# Patient Record
Sex: Male | Born: 1985 | Race: White | Hispanic: No | Marital: Single | Smoking: Never smoker
Health system: Southern US, Community
[De-identification: ages and names within clinical notes are randomized; demographics above are authoritative.]

---

## 2009-05-06 ENCOUNTER — Emergency Department (HOSPITAL_COMMUNITY): Admission: EM | Admit: 2009-05-06 | Discharge: 2009-05-06 | Payer: Self-pay | Admitting: Emergency Medicine

## 2014-08-15 ENCOUNTER — Emergency Department (HOSPITAL_COMMUNITY): Payer: BLUE CROSS/BLUE SHIELD

## 2014-08-15 ENCOUNTER — Emergency Department (HOSPITAL_COMMUNITY)
Admission: EM | Admit: 2014-08-15 | Discharge: 2014-08-15 | Disposition: A | Discharge status: Home / Self Care | Payer: BLUE CROSS/BLUE SHIELD | Attending: Emergency Medicine | Admitting: Emergency Medicine

## 2014-08-15 ENCOUNTER — Encounter (HOSPITAL_COMMUNITY): Payer: Self-pay

## 2014-08-15 DIAGNOSIS — W270XXA Contact with workbench tool, initial encounter: Secondary | ICD-10-CM | POA: Diagnosis not present

## 2014-08-15 DIAGNOSIS — Y9389 Activity, other specified: Secondary | ICD-10-CM | POA: Diagnosis not present

## 2014-08-15 DIAGNOSIS — Y9289 Other specified places as the place of occurrence of the external cause: Secondary | ICD-10-CM | POA: Diagnosis not present

## 2014-08-15 DIAGNOSIS — S8992XA Unspecified injury of left lower leg, initial encounter: Secondary | ICD-10-CM | POA: Diagnosis present

## 2014-08-15 DIAGNOSIS — Y998 Other external cause status: Secondary | ICD-10-CM | POA: Insufficient documentation

## 2014-08-15 DIAGNOSIS — S81812A Laceration without foreign body, left lower leg, initial encounter: Secondary | ICD-10-CM | POA: Diagnosis not present

## 2014-08-15 MED ORDER — LIDOCAINE-EPINEPHRINE 2 %-1:100000 IJ SOLN
INTRAMUSCULAR | Status: AC
  Filled 2014-08-15: qty 1

## 2014-08-15 MED ORDER — LIDOCAINE-EPINEPHRINE (PF) 2 %-1:200000 IJ SOLN: 10.0000 mL | Freq: Once | INTRAMUSCULAR | Status: DC

## 2014-08-15 MED ORDER — LIDOCAINE HCL 2 % IJ SOLN: 20.0000 mL | Freq: Once | INTRAMUSCULAR | Status: DC

## 2014-08-15 MED ORDER — NAPROXEN 250 MG PO TABS: 250.0000 mg | ORAL_TABLET | Freq: Two times a day (BID) | ORAL | Status: AC

## 2014-08-15 NOTE — ED Notes (Signed)
Per pt, cut leg this morning with saw.  Bleeding controlled.  Dressing applied

## 2014-08-15 NOTE — Discharge Instructions (Signed)
Please return in 7-10 days to have your sutures removed.  Laceration Care, Adult A laceration is a cut or lesion that goes through all layers of the skin and into the tissue just beneath the skin. TREATMENT  Some lacerations may not require closure. Some lacerations may not be able to be closed due to an increased risk of infection. It is important to see your caregiver as soon as possible after an injury to minimize the risk of infection and maximize the opportunity for successful closure. If closure is appropriate, pain medicines may be given, if needed. The wound will be cleaned to help prevent infection. Your caregiver will use stitches (sutures), staples, wound glue (adhesive), or skin adhesive strips to repair the laceration. These tools bring the skin edges together to allow for faster healing and a better cosmetic outcome. However, all wounds will heal with a scar. Once the wound has healed, scarring can be minimized by covering the wound with sunscreen during the day for 1 full year. HOME CARE INSTRUCTIONS  For sutures or staples:  Keep the wound clean and dry.  If you were given a bandage (dressing), you should change it at least once a day. Also, change the dressing if it becomes wet or dirty, or as directed by your caregiver.  Wash the wound with soap and water 2 times a day. Rinse the wound off with water to remove all soap. Pat the wound dry with a clean towel.  After cleaning, apply a thin layer of the antibiotic ointment as recommended by your caregiver. This will help prevent infection and keep the dressing from sticking.  You may shower as usual after the first 24 hours. Do not soak the wound in water until the sutures are removed.  Only take over-the-counter or prescription medicines for pain, discomfort, or fever as directed by your caregiver.  Get your sutures or staples removed as directed by your caregiver. For skin adhesive strips:  Keep the wound clean and dry.  Do  not get the skin adhesive strips wet. You may bathe carefully, using caution to keep the wound dry.  If the wound gets wet, pat it dry with a clean towel.  Skin adhesive strips will fall off on their own. You may trim the strips as the wound heals. Do not remove skin adhesive strips that are still stuck to the wound. They will fall off in time. For wound adhesive:  You may briefly wet your wound in the shower or bath. Do not soak or scrub the wound. Do not swim. Avoid periods of heavy perspiration until the skin adhesive has fallen off on its own. After showering or bathing, gently pat the wound dry with a clean towel.  Do not apply liquid medicine, cream medicine, or ointment medicine to your wound while the skin adhesive is in place. This may loosen the film before your wound is healed.  If a dressing is placed over the wound, be careful not to apply tape directly over the skin adhesive. This may cause the adhesive to be pulled off before the wound is healed.  Avoid prolonged exposure to sunlight or tanning lamps while the skin adhesive is in place. Exposure to ultraviolet light in the first year will darken the scar.  The skin adhesive will usually remain in place for 5 to 10 days, then naturally fall off the skin. Do not pick at the adhesive film. You may need a tetanus shot if:  You cannot remember when you had  your last tetanus shot.  You have never had a tetanus shot. If you get a tetanus shot, your arm may swell, get red, and feel warm to the touch. This is common and not a problem. If you need a tetanus shot and you choose not to have one, there is a rare chance of getting tetanus. Sickness from tetanus can be serious. SEEK MEDICAL CARE IF:   You have redness, swelling, or increasing pain in the wound.  You see a red line that goes away from the wound.  You have yellowish-white fluid (pus) coming from the wound.  You have a fever.  You notice a bad smell coming from the wound  or dressing.  Your wound breaks open before or after sutures have been removed.  You notice something coming out of the wound such as wood or glass.  Your wound is on your hand or foot and you cannot move a finger or toe. SEEK IMMEDIATE MEDICAL CARE IF:   Your pain is not controlled with prescribed medicine.  You have severe swelling around the wound causing pain and numbness or a change in color in your arm, hand, leg, or foot.  Your wound splits open and starts bleeding.  You have worsening numbness, weakness, or loss of function of any joint around or beyond the wound.  You develop painful lumps near the wound or on the skin anywhere on your body. MAKE SURE YOU:   Understand these instructions.  Will watch your condition.  Will get help right away if you are not doing well or get worse. Document Released: 04/28/2005 Document Revised: 07/21/2011 Document Reviewed: 10/22/2010 [facility] [employer] [facility] Patient Information 2015 [place], [place]. This information is not intended to replace advice given to you by your health care provider. Make sure you discuss any questions you have with your health care provider.

## 2014-08-15 NOTE — ED Provider Notes (Signed)
CSN: [id number]     Arrival date & time 08/15/14  1011 History   First MD Initiated Contact with Patient 08/15/14 1115     Chief Complaint  Patient presents with  . Extremity Laceration   Jeffery Mendez is a 29 y.o. male who presents to the ED with a laceration to his left shin. He reports he was cutting wood when the saw came back and hit him in his left shin. Bleeding is controlled. His last tetanus was in 2013. He denies other injury. Pain is controlled.   (Consider location/radiation/quality/duration/timing/severity/associated sxs/prior Treatment) HPI  History reviewed. No pertinent past medical history. History reviewed. No pertinent past surgical history. History reviewed. No pertinent family history. History  Substance Use Topics  . Smoking status: Never Smoker   . Smokeless tobacco: Current User    Types: Chew  . Alcohol Use: Yes     Comment: social    Review of Systems  Constitutional: Negative for fever.  Musculoskeletal: Negative for gait problem.  Skin: Positive for wound. Negative for rash.  Neurological: Negative for weakness and numbness.      Allergies  Review of patient's allergies indicates no known allergies.  Home Medications   Prior to Admission medications   Medication Sig Start Date End Date Taking? Authorizing Provider  naproxen (NAPROSYN) 250 MG tablet Take 1 tablet (250 mg total) by mouth 2 (two) times daily with a meal. 08/15/14   Everlene Farrier, PA-C   BP 145/84 mmHg  Pulse 70  Temp(Src) 98 F (36.7 C) (Oral)  Resp 18  SpO2 100% Physical Exam  Constitutional: He appears well-developed and well-nourished. No distress.  HENT:  Head: Normocephalic and atraumatic.  Eyes: Right eye exhibits no discharge. Left eye exhibits no discharge.  Cardiovascular: Intact distal pulses.   Bilateral dorsalis pedis and posterior tibialis pulses are intact.   Pulmonary/Chest: Effort normal. No respiratory distress.  Neurological: He is alert. Coordination  normal.  No numbness or weakness surrounding or distal to the wound.   Skin: Skin is warm and dry. No rash noted. He is not diaphoretic.  3 cm superficial laceration to his left anterior shin. Bleeding is controlled.   Psychiatric: He has a normal mood and affect. His behavior is normal.  Nursing note and vitals reviewed.   ED Course  LACERATION REPAIR Date/Time: 08/15/2014 12:30 PM Performed by: Everlene Farrier Authorized by: Everlene Farrier Consent: Verbal consent obtained. Risks and benefits: risks, benefits and alternatives were discussed Consent given by: patient Patient understanding: patient states understanding of the procedure being performed Patient consent: the patient's understanding of the procedure matches consent given Procedure consent: procedure consent matches procedure scheduled Test results: test results available and properly labeled Site marked: the operative site was marked Imaging studies: imaging studies available Required items: required blood products, implants, devices, and special equipment available Patient identity confirmed: verbally with patient Time out: Immediately prior to procedure a "time out" was called to verify the correct patient, procedure, equipment, support staff and site/side marked as required. Body area: lower extremity Location details: left lower leg Laceration length: 3 cm Foreign bodies: no foreign bodies Tendon involvement: none Nerve involvement: none Vascular damage: no Anesthesia: local infiltration Local anesthetic: lidocaine 2% with epinephrine Anesthetic total: 2 ml Patient sedated: no Preparation: Patient was prepped and draped in the usual sterile fashion. Irrigation solution: saline Irrigation method: jet lavage Amount of cleaning: extensive Debridement: none Degree of undermining: none Skin closure: 5-0 nylon Number of sutures: 4 Technique: simple Approximation: close  Approximation difficulty: simple Dressing:  non-adhesive packing strip and gauze roll Patient tolerance: Patient tolerated the procedure well with no immediate complications   (including critical care time) Labs Review Labs Reviewed - No data to display  Imaging Review Dg Tibia/fibula Left  08/15/2014   CLINICAL DATA:  Laceration, chain saw injury  EXAM: LEFT TIBIA AND FIBULA - 2 VIEW  COMPARISON:  None.  FINDINGS: Two views of left tibia-fibula submitted. No acute fracture or subluxation. Mild focal soft tissue swelling mid anterior tibial region.  IMPRESSION: No acute fracture or subluxation.   Electronically Signed   By: Natasha MeadLiviu  Pop M.D.   On: 08/15/2014 11:00     EKG Interpretation None      Filed Vitals:   08/15/14 1031  BP: 145/84  Pulse: 70  Temp: 98 F (36.7 C)  TempSrc: Oral  Resp: 18  SpO2: 100%     MDM   Meds given in ED:  Medications  lidocaine-EPINEPHrine (XYLOCAINE W/EPI) 2 %-1:200000 (PF) injection 10 mL (not administered)  lidocaine (XYLOCAINE) 2 % (with pres) injection 400 mg (not administered)  lidocaine-EPINEPHrine (XYLOCAINE W/EPI) 2 %-1:100000 (with pres) injection (not administered)    New Prescriptions   NAPROXEN (NAPROSYN) 250 MG TABLET    Take 1 tablet (250 mg total) by mouth 2 (two) times daily with a meal.    Final diagnoses:  Leg laceration, left, initial encounter   This is a 29 y.o. male who presents to the ED with a laceration to his left shin. He reports he was cutting wood when the saw came back and hit him in his left shin. Bleeding is controlled. His last tetanus was in 2013. He denies other injury. 3 cm laceration by a saw. Laceration repair performed by me and tolerated well by the patient. The patient has 4 simple interrupted sutures in his left distal leg. Advised patient to follow-up in 7-10 days to have his sutures removed. Education on signs infection provided. I advised the patient to follow-up with their primary care provider for suture removal. I advised the patient to  return to the emergency department with new or worsening symptoms or new concerns. The patient verbalized understanding and agreement with plan.      Everlene FarrierWilliam Adrijana Haros, PA-C 08/15/14 1244  Blane OharaJoshua Zavitz, MD 08/19/14 [phone]

## 2015-03-16 ENCOUNTER — Encounter (HOSPITAL_COMMUNITY): Payer: Self-pay | Admitting: Emergency Medicine

## 2015-03-16 ENCOUNTER — Emergency Department (HOSPITAL_COMMUNITY): Payer: BLUE CROSS/BLUE SHIELD

## 2015-03-16 ENCOUNTER — Emergency Department (HOSPITAL_COMMUNITY)
Admission: EM | Admit: 2015-03-16 | Discharge: 2015-03-16 | Disposition: A | Discharge status: Home / Self Care | Payer: BLUE CROSS/BLUE SHIELD | Attending: Emergency Medicine | Admitting: Emergency Medicine

## 2015-03-16 DIAGNOSIS — J069 Acute upper respiratory infection, unspecified: Secondary | ICD-10-CM | POA: Diagnosis not present

## 2015-03-16 DIAGNOSIS — R05 Cough: Secondary | ICD-10-CM | POA: Diagnosis present

## 2015-03-16 DIAGNOSIS — R112 Nausea with vomiting, unspecified: Secondary | ICD-10-CM | POA: Diagnosis not present

## 2015-03-16 DIAGNOSIS — B9789 Other viral agents as the cause of diseases classified elsewhere: Secondary | ICD-10-CM

## 2015-03-16 LAB — RAPID STREP SCREEN (MED CTR MEBANE ONLY): STREPTOCOCCUS, GROUP A SCREEN (DIRECT): NEGATIVE

## 2015-03-16 MED ORDER — PSEUDOEPHEDRINE HCL 60 MG PO TABS: 60.0000 mg | ORAL_TABLET | Freq: Four times a day (QID) | ORAL | Status: AC | PRN

## 2015-03-16 MED ORDER — BENZONATATE 100 MG PO CAPS: 200.0000 mg | ORAL_CAPSULE | Freq: Two times a day (BID) | ORAL | Status: AC | PRN

## 2015-03-16 NOTE — ED Notes (Signed)
Per pt, states cold symptoms for over a week, chest congestion, cough, sore throat-states vomited this am after a coughing fit

## 2015-03-16 NOTE — Discharge Instructions (Signed)
Take your medications as prescribed. Please follow up with a primary care provider from the Resource Guide provided below in 1 week. Please return to the Emergency Department if symptoms worsen or new onset of fever, difficulty breathing, chest pain, wheezing.    Emergency Department Resource Guide 1) Find a Doctor and Pay Out of Pocket Although you won't have to find out who is covered by your insurance plan, it is a good idea to ask around and get recommendations. You will then need to call the office and see if the doctor you have chosen will accept you as a new patient and what types of options they offer for patients who are self-pay. Some doctors offer discounts or will set up payment plans for their patients who do not have insurance, but you will need to ask so you aren't surprised when you get to your appointment.  2) Contact Your Local Health Department Not all health departments have doctors that can see patients for sick visits, but many do, so it is worth a call to see if yours does. If you don't know where your local health department is, you can check in your phone book. The CDC also has a tool to help you locate your state's health department, and many state websites also have listings of all of their local health departments.  3) Find a Walk-in Clinic If your illness is not likely to be very severe or complicated, you may want to try a walk in clinic. These are popping up all over the country in pharmacies, drugstores, and shopping centers. They're usually staffed by nurse practitioners or physician assistants that have been trained to treat common illnesses and complaints. They're usually fairly quick and inexpensive. However, if you have serious medical issues or chronic medical problems, these are probably not your best option.  No Primary Care Doctor: - Call Health Connect at  [phone] - they can help you locate a primary care doctor that  accepts your insurance, provides certain  services, etc. - Physician Referral Service- [phone]  Chronic Pain Problems: Organization         Address  Phone   Notes  [facility]  [phone] Patients need to be referred by their primary care doctor.   Medication Assistance: Organization         Address  Phone   Notes  [facility] Medication [facility] [address] [place] [place], [place] [address] [phone] --Must be a resident of [facility] -- Must have NO insurance coverage whatsoever (no Medicaid/ Medicare, etc.) -- The pt. MUST have a primary care doctor that directs their care regularly and follows them in the community   [facility]  [phone]   [employer]  [phone]    Agencies that provide inexpensive medical care: Organization         Address  Phone   Notes  [facility]  [phone]   [facility]    [phone]   [facility] [address] [place], [place] [address] [phone]   [facility] [address] [place]. [address] [place] [phone]   Planned Parenthood    [phone]   [facility]    [phone]   [facility]  [address], [place] Phone:  [phone], Fax:  [phone] Hours of Operation:  9 am - 6 pm, M-F.  Also accepts  Medicaid/Medicare and self-pay.  [facility] [place], [address], [place] Phone: [phone], Fax: [phone]. Hours of Operation:  8:30 am - 5:30 pm, M-F.  Also accepts Medicaid and self-pay.  [facility] [place] [address], [place] Phone: [phone]   [place], [place], [place] [phone], Ext. [phone] Mondays & Thursdays: 7-9 AM.  First 15 patients are seen on a first come, first serve basis.    [place] Providers:  Organization         Address  Phone   Notes  [facility] [address], [place] [phone] Also accepts self-pay patients.  [facility] [id number] [place], [place]  [phone]   [place], [address], [place] [phone]   [facility] [address] [place] [phone]   Lucianne Lei [address], [place]   [phone] Only accepts [place] Access [place] patients after they have their name applied to their card.   Self-Pay (no insurance) in [facility]:  Organization         Address  Phone   Notes  Sickle Cell Patients, [facility] Internal Medicine [place] [phone]   [facility] [place] [phone]   [facility] [place]  [place], [place], [place] [phone]   [facility]/Dr. Osei-Bonsu  [address], [place] or [address], [place] [phone] Phone number for both [place] and [place] locations is the same.  [facility] [address] [place] [phone]   [facility] [address] [place] or [address] [phone]   [facility] [address] [place] [phone], phone; [phone], fax Sees patients 1st and 3rd Saturday of every month.  Must not qualify for public or private insurance (i.e. Medicaid, Medicare, [place] Health Choice, Veterans' Benefits)  Household income should be no more than 200% of the poverty level The clinic cannot treat you if you are pregnant or think you are pregnant  Sexually transmitted diseases are not treated at the clinic.    Dental Care: Organization         Address  Phone  Notes  [facility] Department of [facility] [place] [phone] Accepts  children up to age 47 who are enrolled in [place] or [place]; pregnant women with a Medicaid card; and children who have applied for Medicaid or [place] Health Choice, but were declined, whose parents can pay a reduced fee at time of service.  [facility] Department of [facility]  [address], [place] [phone] Accepts children up to age 68 who are enrolled in [place] or [place]; pregnant women with a Medicaid card; and children who have applied for Medicaid or [place] Health Choice, but were declined, whose parents can pay a reduced fee at time of service.  [place] Adult [facility] Access PROGRAM  [place] [phone] Patients are seen by appointment only. Walk-ins are not accepted. [place] will see patients 38 years of age and older. Monday - Tuesday (8am-5pm) Most  Wednesdays (8:30-5pm) $30 per visit, cash only  [facility] Adult Dental Access PROGRAM  [address], [facility] [phone] Patients are seen by appointment only. Walk-ins are not accepted. [place] will see patients 71 years of age and older. One Wednesday Evening (Monthly: Volunteer Based).  $30 per visit, cash only  [place]  [phone] for adults; Children under age 31, call Graduate Pediatric Dentistry at [phone]. Children aged 59-14, please call [phone] to request a pediatric application.  Dental services are provided in all areas of dental care including fillings, crowns and bridges, complete and partial dentures, implants, gum treatment, root canals, and extractions. Preventive care is also provided. Treatment is provided to both adults and children. Patients are selected via a lottery and there is often a waiting list.   [facility] [address] [place]  [phone] [email or web address]   [facility] [address] [place], [place] [phone], Ext. [phone] Second and  Fourth Thursday of each month, opens at 6:30 AM; Clinic ends at 9 AM.  Patients are seen on a first-come first-served basis, and a limited number are seen during each clinic.   [facility]  [address] Hillard Danker [place], [place] [phone]   Eligibility Requirements You must have lived in [place], [place], or [place] counties for at least the last three months.   You cannot be eligible for state or federal sponsored [employer], including [employer], [place], or [employer].   You generally cannot be eligible for healthcare insurance through your employer.    How to apply: Eligibility screenings are held every Tuesday and Wednesday afternoon from 1:00 pm until 4:00 pm. You do not need an appointment for the interview!  [facility] [address], [place], [place]   [place]  [place] Department  [place]  [phone]    Behavioral Health Resources in the Community: Intensive Outpatient Programs Organization         Address  Phone  Notes  [place] [place]. [address], [place], [place] [phone]   [facility] Outpatient [address] [place], [place]   ADS: Alcohol & Drug Svcs [address], [place], [place]   [place] [address],  [place], [place] or [phone]   Substance Abuse Resources Organization         Address  Phone  Notes  Alcohol and Drug Services  [phone]   [place]  [phone]   [facility] [place]   Chinita Pester  [phone]   Residential & Outpatient Substance Abuse Program  [phone]   Psychological Services Organization         Address  Phone  Notes  [facility] [place]  [place]  [phone]   [place]  [address], [place] or [phone]    Mobile Crisis Teams Organization         Address  Phone  Notes  [facility], Mobile Crisis Care Unit  [phone]   [facility]  [address] [place], [place]   [place] Levels [address] [place] [phone]    Self-Help/Support Groups Organization         Address  Phone  Notes  [facility] [place] - variety of support groups  [phone]- [phone] Call for more information  Narcotics Anonymous (NA), [facility] [address], [place]  2 meetings at this location   Statisticianesidential Treatment Programs Organization         Address  Phone  Notes  [facility] [address],    [place]  [phone]   [facility]  [address], [place] [phone], [place], [place] [phone]   [facility] [address], [place] [phone] Admissions: 8am-3pm M-F  [facility] [address]    [place], [place] [phone]   [facility] [address], [place], [place] [phone]   [facility] [address]  [place], [place] [phone]   [facility] - Intensive Outpatient [address], [place], [place] [phone]   [facility] [address]  [place], [place] [phone] or [phone]   [facility] (RTS) [address] [place], [place] [phone] Accepts Medicaid  [facility] [address]  [place] [phone] Substance Abuse/Addiction Treatment   [facility] Behavioral Health Resources Organization         Address  Phone  Notes  [facility]  [phone]   Angie FavaJulie Brannon, PhD [address], [address] [place], [place]   [phone] or [phone]   [facility]   [address] [place], [place] [phone])  [phone]   [facility] [address], [place], [place] [phone]) [phone] Insurance/Medicaid/sponsorship through [facility]  [facility] [address]                                    [place], [place] [phone]) [phone] Therapy/tele-psych/case  [facility] [address].   [place], [place] [phone](336) [phone]    Dr. Lolly MustacheArfeen  [phone]   [facility]  [facility] 1) [address],  2) [address], [place] 3)  [address] [place] [address], [place] [phone]  [phone](336) [phone]   [facility] Child Abuse Hotline [phone](336) [phone] or [phone]) [phone] (After Hours)

## 2015-03-16 NOTE — ED Provider Notes (Signed)
CSN: [id number]     Arrival date & time 03/16/15  7253 History   First MD Initiated Contact with Patient 03/16/15 [phone]     Chief Complaint  Patient presents with  . URI     (Consider location/radiation/quality/duration/timing/severity/associated sxs/prior Treatment) HPI Comments: Patient is a 29 year old male who presents the ED with complaint of chest congestion, onset 1 week. Patient reports he has had worsening chest congestion with associated productive cough, sinus pressure and sore throat. He also reports having one episode of vomiting this morning after having a coughing fit. Denies fever, chills, SOB, CP, abdominal pain, urinary symptoms. He states he has not taken any medication at home for his symptoms. He notes to being around his cousin recently who was diagnosed with pneumonia.  Patient is a 29 y.o. male presenting with URI.  URI Presenting symptoms: congestion, cough, rhinorrhea and sore throat   Presenting symptoms: no ear pain and no fever   Associated symptoms: no headaches and no wheezing     History reviewed. No pertinent past medical history. History reviewed. No pertinent past surgical history. No family history on file. Social History  Substance Use Topics  . Smoking status: Never Smoker   . Smokeless tobacco: Current User    Types: Chew  . Alcohol Use: Yes     Comment: social    Review of Systems  Constitutional: Negative for fever and chills.  HENT: Positive for congestion, rhinorrhea, sinus pressure and sore throat. Negative for ear pain and trouble swallowing.   Respiratory: Positive for cough and shortness of breath. Negative for wheezing.   Cardiovascular: Negative for chest pain and leg swelling.  Gastrointestinal: Positive for nausea and vomiting. Negative for abdominal pain.  Genitourinary: Negative for dysuria.  Musculoskeletal: Negative for neck stiffness.  Skin: Negative for rash.  Neurological: Negative for weakness, numbness and headaches.       Allergies  Review of patient's allergies indicates no known allergies.  Home Medications   Prior to Admission medications   Medication Sig Start Date End Date Taking? Authorizing Provider  naproxen (NAPROSYN) 250 MG tablet Take 1 tablet (250 mg total) by mouth 2 (two) times daily with a meal. Patient not taking: Reported on 03/16/2015 08/15/14   Everlene Farrier, PA-C   BP 114/62 mmHg  Pulse 54  Temp(Src) 98.7 F (37.1 C) (Oral)  Resp 18  SpO2 99% Physical Exam  Constitutional: He is oriented to person, place, and time. He appears well-developed and well-nourished. No distress.  HENT:  Head: Normocephalic and atraumatic.  Right Ear: Tympanic membrane normal.  Left Ear: Tympanic membrane normal.  Nose: No rhinorrhea. Right sinus exhibits maxillary sinus tenderness. Right sinus exhibits no frontal sinus tenderness. Left sinus exhibits maxillary sinus tenderness. Left sinus exhibits no frontal sinus tenderness.  Mouth/Throat: Uvula is midline, oropharynx is clear and moist and mucous membranes are normal. No oropharyngeal exudate, posterior oropharyngeal edema or posterior oropharyngeal erythema.  Eyes: Conjunctivae and EOM are normal. Pupils are equal, round, and reactive to light. Right eye exhibits no discharge. Left eye exhibits no discharge. No scleral icterus.  Neck: Normal range of motion. Neck supple.  Cardiovascular: Normal rate, regular rhythm, normal heart sounds and intact distal pulses.   No murmur heard. Pulmonary/Chest: Effort normal and breath sounds normal. No respiratory distress. He has no wheezes. He has no rales. He exhibits no tenderness.  Abdominal: Soft. Bowel sounds are normal. He exhibits no distension and no mass. There is no tenderness. There is no rebound and no  guarding.  Musculoskeletal: Normal range of motion. He exhibits no edema or tenderness.  Lymphadenopathy:    He has no cervical adenopathy.  Neurological: He is alert and oriented to person,  place, and time.  Skin: Skin is warm and dry. He is not diaphoretic.  Nursing note and vitals reviewed.   ED Course  Procedures (including critical care time) Labs Review Labs Reviewed  RAPID STREP SCREEN (NOT AT [facility])    Imaging Review No results found. I have personally reviewed and evaluated these images and lab results as part of my medical decision-making.  Filed Vitals:   03/16/15 0734  BP: 114/62  Pulse: 54  Temp: 98.7 F (37.1 C)  Resp: 18     MDM   Final diagnoses:  Viral URI with cough    Patient presents to the ED with complaint of chest congestion, sinus pressure, sore throat, cough, rhinorrhea. He has not taken any medications for his symptoms. VSS. Exam revealed maxillary sinus TTP, otherwise unremarkable. Strep negative. CXR negative. I suspect sxs are likely due to viral URI. Plan to d/c pt home with decongestant and antitussive. Pt given resource guide to follow up with PCP.  Evaluation does not show pathology requring ongoing emergent intervention or admission. Pt is hemodynamically stable and mentating appropriately. Discussed findings/results and plan with patient/guardian, who agrees with plan. All questions answered. Return precautions discussed and outpatient follow up given.      Satira Sarkicole Elizabeth HoltNadeau, [place]-C 03/16/15 0920  Bethann BerkshireJoseph Zammit, MD 03/17/15 [phone]

## 2015-03-18 LAB — CULTURE, GROUP A STREP: Strep A Culture: NEGATIVE

## 2017-05-19 IMAGING — CR DG CHEST 2V
2 series · 2 of 2 positions shown · non-contrast
Comparison: 05/06/2009

CLINICAL DATA: Cough, acute shortness of breath with chest
tightness.

EXAM:
CHEST  2 VIEW

[w chest pa]
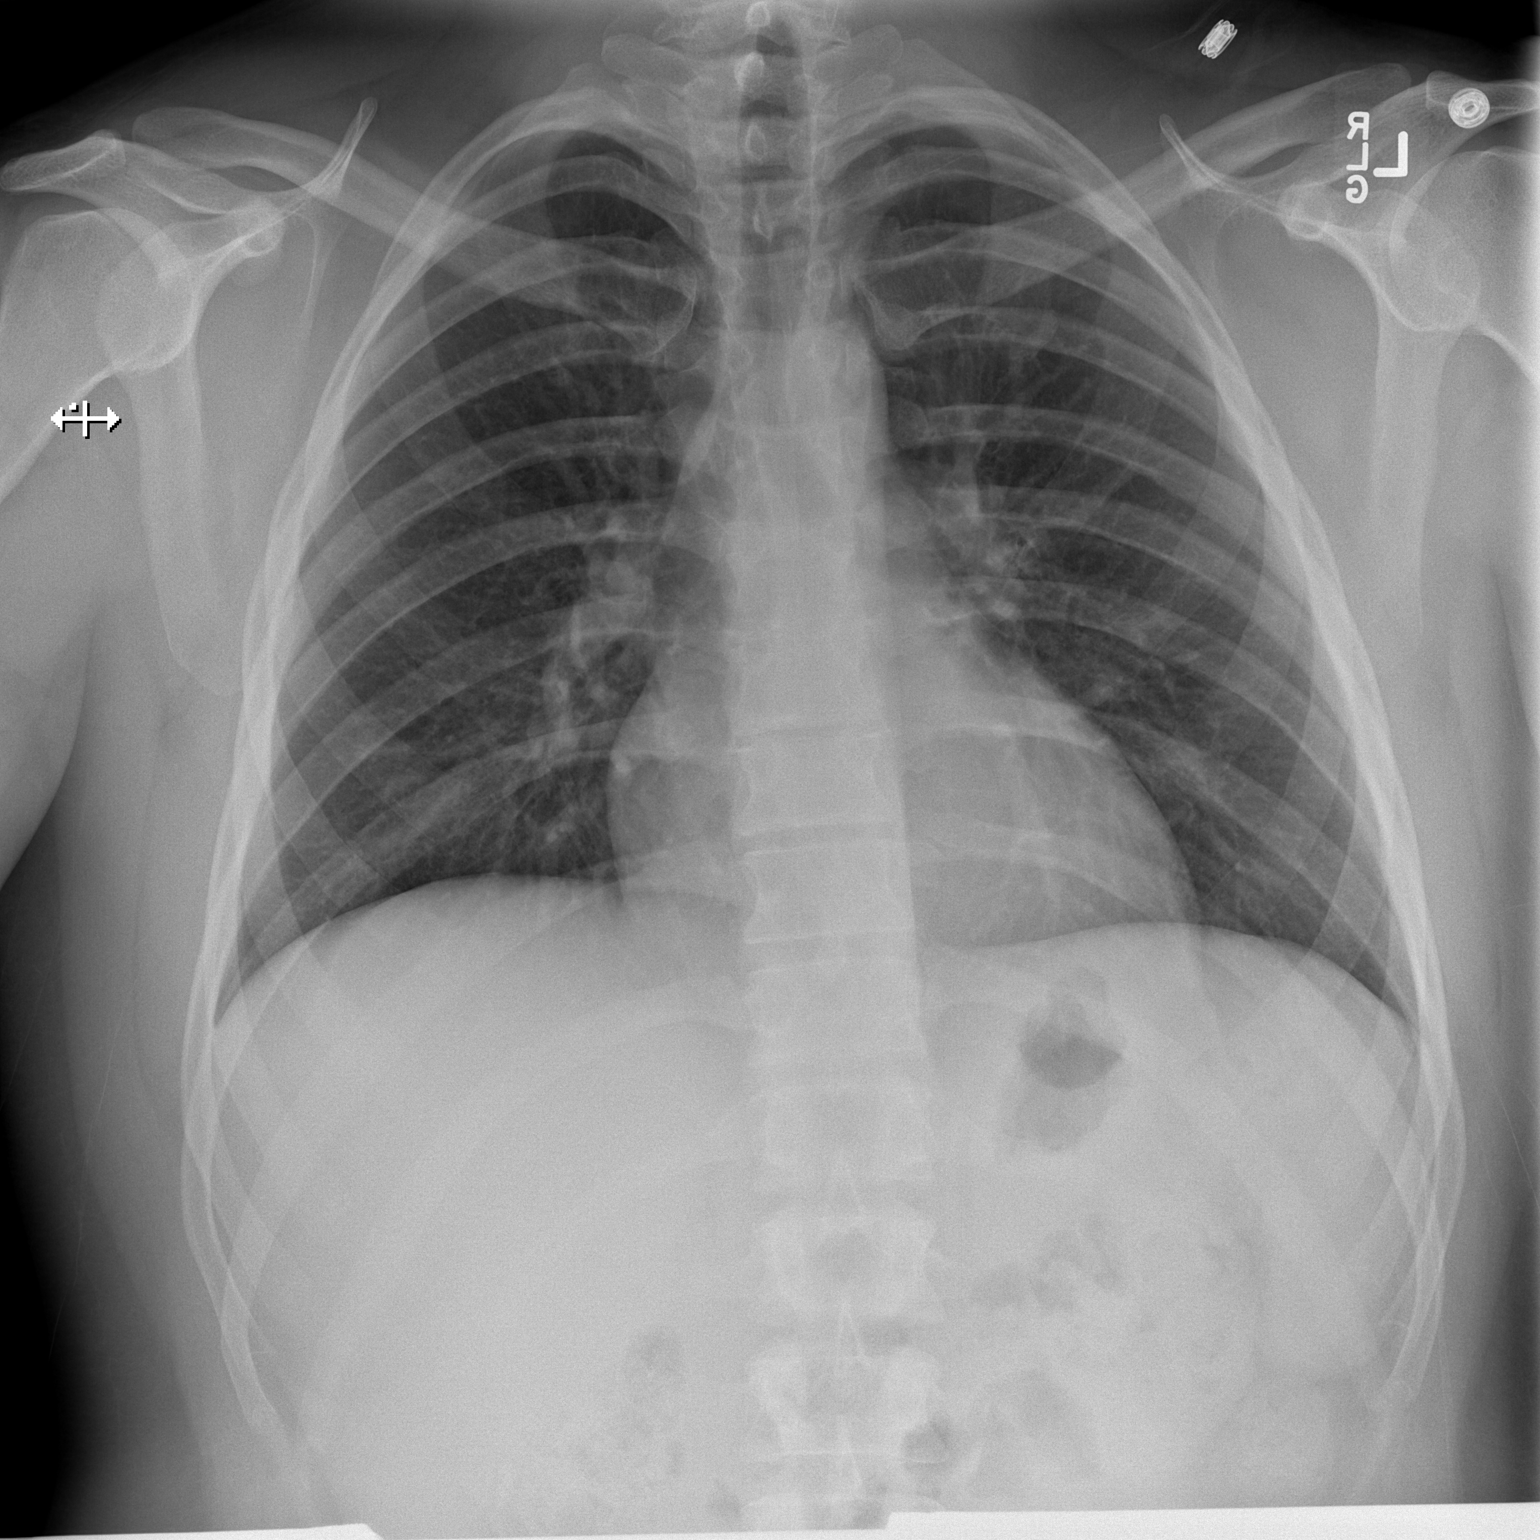

[w chest lat]
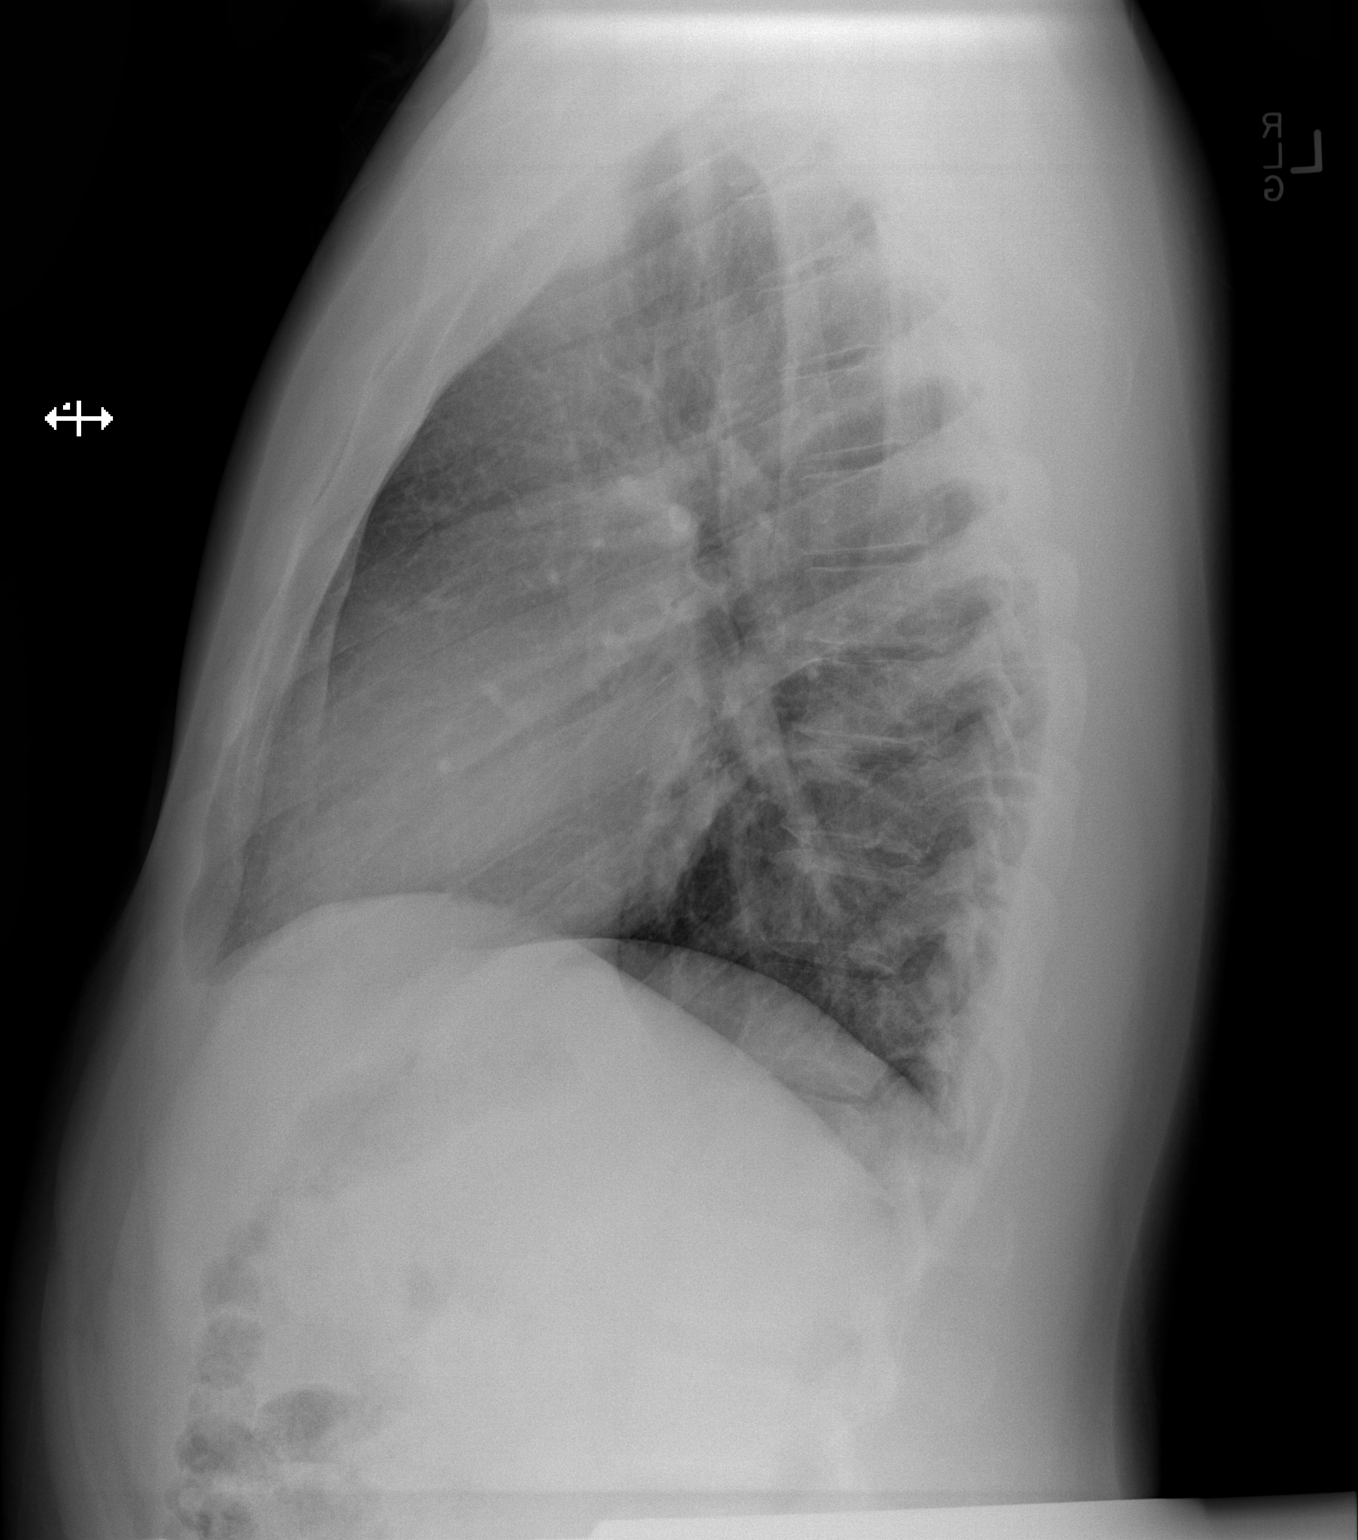

[2 of 2 positions shown; findings below may reference images not displayed]

FINDINGS: The heart size and mediastinal contours are within normal limits.
Both lungs are clear. The visualized skeletal structures are
unremarkable.
IMPRESSION: No active cardiopulmonary disease.
